# Patient Record
Sex: Female | Born: 1970 | Hispanic: No | Marital: Married | Smoking: Never smoker
Health system: Southern US, Community
[De-identification: ages and names within clinical notes are randomized; demographics above are authoritative.]

---

## 2003-03-22 ENCOUNTER — Other Ambulatory Visit: Admission: RE | Admit: 2003-03-22 | Discharge: 2003-03-22 | Payer: Self-pay | Admitting: *Deleted

## 2003-04-22 ENCOUNTER — Encounter: Payer: Self-pay | Admitting: Emergency Medicine

## 2003-04-22 ENCOUNTER — Emergency Department (HOSPITAL_COMMUNITY): Admission: EM | Admit: 2003-04-22 | Discharge: 2003-04-22 | Payer: Self-pay | Admitting: Emergency Medicine

## 2003-04-26 ENCOUNTER — Encounter: Payer: Self-pay | Admitting: Neurology

## 2003-04-26 ENCOUNTER — Ambulatory Visit (HOSPITAL_COMMUNITY): Admission: RE | Admit: 2003-04-26 | Discharge: 2003-04-26 | Payer: Self-pay | Admitting: Neurology

## 2003-09-20 ENCOUNTER — Inpatient Hospital Stay (HOSPITAL_COMMUNITY): Admission: AD | Admit: 2003-09-20 | Discharge: 2003-09-22 | Payer: Self-pay | Admitting: Obstetrics & Gynecology

## 2004-01-01 ENCOUNTER — Encounter: Admission: RE | Admit: 2004-01-01 | Discharge: 2004-01-01 | Payer: Self-pay | Admitting: Obstetrics and Gynecology

## 2004-01-04 ENCOUNTER — Ambulatory Visit (HOSPITAL_COMMUNITY): Admission: RE | Admit: 2004-01-04 | Discharge: 2004-01-04 | Payer: Self-pay | Admitting: *Deleted

## 2020-05-16 ENCOUNTER — Other Ambulatory Visit: Payer: Self-pay | Admitting: Obstetrics and Gynecology

## 2020-05-16 DIAGNOSIS — Z1231 Encounter for screening mammogram for malignant neoplasm of breast: Secondary | ICD-10-CM

## 2020-07-23 ENCOUNTER — Ambulatory Visit
Admission: RE | Admit: 2020-07-23 | Discharge: 2020-07-23 | Disposition: A | Discharge status: Home / Self Care | Payer: No Typology Code available for payment source | Source: Ambulatory Visit | Attending: Obstetrics and Gynecology | Admitting: Obstetrics and Gynecology

## 2020-07-23 ENCOUNTER — Other Ambulatory Visit: Payer: Self-pay

## 2020-07-23 ENCOUNTER — Ambulatory Visit: Payer: Self-pay | Admitting: *Deleted

## 2020-07-23 VITALS — BP 120/86 | Wt 222.0 lb

## 2020-07-23 DIAGNOSIS — Z1239 Encounter for other screening for malignant neoplasm of breast: Secondary | ICD-10-CM

## 2020-07-23 DIAGNOSIS — Z1231 Encounter for screening mammogram for malignant neoplasm of breast: Secondary | ICD-10-CM

## 2020-07-23 NOTE — Patient Instructions (Addendum)
Explained breast self awareness with Kristin Rhodes. Patient did not need a Pap smear today due to last Pap smear was 04/12/2020 per patient. Let her know [facility] will cover Pap smears every 3 years unless has a history of abnormal Pap smears. Referred patient to the [facility] for a screening mammogram. Appointment scheduled Tuesday, July 23, 2020 at 1100 on the mobile unit. Patient escorted to the mobile unit following [facility] appointment for her screening mammogram. Let patient know the Breast Center will follow up with her within the next couple weeks with results of her mammogram by letter or phone. Kristin Rhodes verbalized understanding.  [name], Kathaleen Maser, RN 10:16 AM

## 2020-07-23 NOTE — Progress Notes (Signed)
Ms. Kristin Rhodes is a 49 y.o. female who presents to [facility] clinic today with no complaints.    Pap Smear: Pap smear not completed today. Last Pap smear was 04/12/2020 at [facility] clinic and was normal per patient. Per patient has no history of an abnormal Pap smear. Last Pap smear result is not available in Epic.   Physical exam: Breasts Breasts symmetrical. No skin abnormalities bilateral breasts. No nipple retraction bilateral breasts. No nipple discharge bilateral breasts. No lymphadenopathy. No lumps palpated bilateral breasts. No complaints of pain or tenderness on exam.       Pelvic/Bimanual Pap is not indicated today per [facility] guidelines.   Smoking History: Patient has never smoked.   Patient Navigation: Patient education provided. Access to services provided for patient through [employer] program. Used Spanish interpreter Natale Lay from [facility] provided.    Colorectal Cancer Screening: Per patient has never had colonoscopy completed. No complaints today.    Breast and Cervical Cancer Risk Assessment: Patient has family history of her mother and maternal grandmother having breast cancer. Patient has no known genetic mutations or history of radiation treatment to the chest before age 75. Patient does not have history of cervical dysplasia, immunocompromised, or DES exposure in-utero.  Risk Assessment    Risk Scores      07/23/2020   Last edited by: Priscille Heidelberg, RN   5-year risk: 1.2 %   Lifetime risk: 11 %          A: [facility] exam without pap smear No complaints.  P: Referred patient to the [facility] for a screening mammogram on the mobile unit. Appointment scheduled Tuesday, July 23, 2020 at 1100.  Priscille Heidelberg, RN 07/23/2020 10:16 AM

## 2021-03-03 ENCOUNTER — Other Ambulatory Visit: Payer: Self-pay

## 2021-03-03 ENCOUNTER — Encounter (HOSPITAL_COMMUNITY): Payer: Self-pay

## 2021-03-03 ENCOUNTER — Emergency Department (HOSPITAL_COMMUNITY): Payer: No Typology Code available for payment source

## 2021-03-03 ENCOUNTER — Emergency Department (HOSPITAL_COMMUNITY)
Admission: EM | Admit: 2021-03-03 | Discharge: 2021-03-03 | Disposition: A | Discharge status: Home / Self Care | Payer: No Typology Code available for payment source | Attending: Emergency Medicine | Admitting: Emergency Medicine

## 2021-03-03 DIAGNOSIS — W268XXA Contact with other sharp object(s), not elsewhere classified, initial encounter: Secondary | ICD-10-CM | POA: Insufficient documentation

## 2021-03-03 DIAGNOSIS — Z23 Encounter for immunization: Secondary | ICD-10-CM | POA: Insufficient documentation

## 2021-03-03 DIAGNOSIS — S61211A Laceration without foreign body of left index finger without damage to nail, initial encounter: Secondary | ICD-10-CM | POA: Insufficient documentation

## 2021-03-03 DIAGNOSIS — Y93H2 Activity, gardening and landscaping: Secondary | ICD-10-CM | POA: Insufficient documentation

## 2021-03-03 MED ORDER — CEPHALEXIN 500 MG PO CAPS: 500.0000 mg | ORAL_CAPSULE | Freq: Four times a day (QID) | ORAL | 0 refills | Status: AC

## 2021-03-03 MED ORDER — HYDROCODONE-ACETAMINOPHEN 5-325 MG PO TABS: 2.0000 | ORAL_TABLET | Freq: Four times a day (QID) | ORAL | 0 refills | Status: AC | PRN

## 2021-03-03 MED ORDER — ACETAMINOPHEN 325 MG PO TABS
650.0000 mg | ORAL_TABLET | Freq: Once | ORAL | Status: AC
  Administered 2021-03-03: 650 mg via ORAL
  Filled 2021-03-03: qty 2

## 2021-03-03 MED ORDER — BUPIVACAINE HCL (PF) 0.5 % IJ SOLN
20.0000 mL | Freq: Once | INTRAMUSCULAR | Status: AC
  Administered 2021-03-03: 20 mL
  Filled 2021-03-03: qty 20

## 2021-03-03 MED ORDER — LIDOCAINE HCL 2 % IJ SOLN: 5.0000 mL | Freq: Once | INTRAMUSCULAR | Status: DC

## 2021-03-03 MED ORDER — LIDOCAINE HCL 2 % IJ SOLN
10.0000 mL | Freq: Once | INTRAMUSCULAR | Status: DC
Start: 2021-03-03 — End: 2021-03-03

## 2021-03-03 MED ORDER — BUPIVACAINE HCL 0.25 % IJ SOLN
10.0000 mL | Freq: Once | INTRAMUSCULAR | Status: DC
  Filled 2021-03-03: qty 10

## 2021-03-03 MED ORDER — TETANUS-DIPHTH-ACELL PERTUSSIS 5-2.5-18.5 LF-MCG/0.5 IM SUSY
0.5000 mL | PREFILLED_SYRINGE | Freq: Once | INTRAMUSCULAR | Status: AC
  Administered 2021-03-03: 0.5 mL via INTRAMUSCULAR
  Filled 2021-03-03: qty 0.5

## 2021-03-03 NOTE — ED Provider Notes (Signed)
Emergency Medicine Provider Triage Evaluation Note  Kristin Rhodes , a 50 y.o. female  was evaluated in triage.  Pt complains of laceration to index finger after injuring it with a weedeater 1 hour prior to arrival.  Unknown tetanus..  Review of Systems  Positive: Laceration Negative: Numbness, tingling or weakness in the hand.  Physical Exam  BP (!) 151/95 (BP Location: Right Arm)   Pulse 95   Temp 98.3 F (36.8 C) (Oral)   Resp 16   Ht 5\' 4"  (1.626 m)   Wt 95.3 kg   LMP 05/08/2020 (Exact Date)   SpO2 100%   BMI 36.05 kg/m  Gen:   Awake, no distress   Resp:  Normal effort  MSK:   Moves extremities without difficulty  Other:  Full range of motion of the left index finger.  Extensive laceration to the dorsum of the distal finger, distal to the DIP joint.  Oozing small amount of blood.  Normal sensation in the ventral surface of the hand, normal cap refill.  Medical Decision Making  Medically screening exam initiated at 12:56 PM.  Appropriate orders placed.  Kristin Rhodes was informed that the remainder of the evaluation will be completed by another provider, this initial triage assessment does not replace that evaluation, and the importance of remaining in the ED until their evaluation is complete.  This chart was dictated using voice recognition software, Dragon. Despite the best efforts of this provider to proofread and correct errors, errors may still occur which can change documentation meaning.    Orland Mustard 03/03/21 1257    05/03/21, MD 03/03/21 2012

## 2021-03-03 NOTE — ED Provider Notes (Signed)
[facility] EMERGENCY DEPARTMENT Provider Note   CSN: [id number] Arrival date & time: 03/03/21  1214     History Chief Complaint  Patient presents with   Finger Injury    Kristin Rhodes is a 50 y.o. female.  The history is provided by the patient. The history is limited by a language barrier. A language interpreter was used.  Laceration Location:  Finger Finger laceration location:  L index finger Depth:  Through underlying tissue Quality: stellate   Bleeding: controlled   Injury mechanism: [employer]. Pain details:    Quality:  Sharp and throbbing   Severity:  Severe   Timing:  Constant   Progression:  Unchanged Foreign body present:  Unable to specify Relieved by:  None tried Tetanus status:  Unknown Associated symptoms: swelling   Associated symptoms: no fever, no focal weakness, no numbness and no rash       History reviewed. No pertinent past medical history.  There are no problems to display for this patient.   History reviewed. No pertinent surgical history.   OB History   No obstetric history on file.     Family History  Problem Relation Age of Onset   Breast cancer Mother    Breast cancer Maternal Grandmother     Social History   Tobacco Use   Smoking status: Never   Smokeless tobacco: Never  Vaping Use   Vaping Use: Never used  Substance Use Topics   Alcohol use: Not Currently   Drug use: Never    Home Medications Prior to Admission medications   Medication Sig Start Date End Date Taking? Authorizing Provider  cephALEXin (KEFLEX) 500 MG capsule Take 1 capsule (500 mg total) by mouth 4 (four) times daily for 7 days. 03/03/21 03/10/21 Yes Ernie Avena, MD  HYDROcodone-acetaminophen (NORCO/VICODIN) 5-325 MG tablet Take 2 tablets by mouth every 6 (six) hours as needed for up to 3 days for severe pain. 03/03/21 03/06/21 Yes Ernie Avena, MD    Allergies    Patient has no allergy information on record.  Review of  Systems   Review of Systems  Constitutional:  Negative for chills and fever.  HENT:  Negative for ear pain and sore throat.   Eyes:  Negative for pain and visual disturbance.  Respiratory:  Negative for cough and shortness of breath.   Cardiovascular:  Negative for chest pain and palpitations.  Gastrointestinal:  Negative for abdominal pain and vomiting.  Genitourinary:  Negative for dysuria and hematuria.  Musculoskeletal:  Negative for arthralgias and back pain.  Skin:  Positive for wound. Negative for color change and rash.  Neurological:  Negative for focal weakness, seizures and syncope.  All other systems reviewed and are negative.  Physical Exam Updated Vital Signs BP (!) 154/88 (BP Location: Right Arm)   Pulse 80   Temp 97.9 F (36.6 C) (Oral)   Resp 17   Ht 5\' 4"  (1.626 m)   Wt 95.3 kg   LMP 05/08/2020 (Exact Date)   SpO2 100%   BMI 36.05 kg/m   Physical Exam Vitals and nursing note reviewed.  Constitutional:      General: She is not in acute distress.    Appearance: She is well-developed.     Comments: Tearful, in pain  HENT:     Head: Normocephalic and atraumatic.  Eyes:     Conjunctiva/sclera: Conjunctivae normal.  Cardiovascular:     Rate and Rhythm: Normal rate and regular rhythm.  Heart sounds: No murmur heard. Pulmonary:     Effort: Pulmonary effort is normal. No respiratory distress.     Breath sounds: Normal breath sounds.  Abdominal:     Palpations: Abdomen is soft.     Tenderness: There is no abdominal tenderness.  Musculoskeletal:        General: Swelling, tenderness and signs of injury present.     Cervical back: Neck supple.     Comments: Stellate, jagged, macerated lacerations along the palmar distal tip of the left index finger. No nail bed involvement, no foreign bodies, no tendon involvement, L hand and finger NVI  Skin:    General: Skin is warm and dry.  Neurological:     General: No focal deficit present.     Mental Status: She is  alert. Mental status is at baseline.             ED Results / Procedures / Treatments   Labs (all labs ordered are listed, but only abnormal results are displayed) Labs Reviewed - No data to display  EKG None  Radiology DG Finger Index Left  Result Date: 03/03/2021 CLINICAL DATA:  Left index finger laceration yesterday. EXAM: LEFT INDEX FINGER 2+V COMPARISON:  None. FINDINGS: Significant soft tissue injury distally with swelling and irregularity. Allowing for overlying bandages, no definite radiopaque foreign body. No evidence of acute fracture or dislocation. IMPRESSION: Distal soft tissue injury of the index finger without evidence of acute osseous injury. Electronically Signed   By: Carey Bullocks M.D.   On: 03/03/2021 13:34    Procedures .Marland KitchenLaceration Repair  Date/Time: 03/03/2021 5:35 PM Performed by: Ernie Avena, MD Authorized by: Ernie Avena, MD   Consent:    Consent obtained:  Verbal   Consent given by:  Patient   Risks discussed:  Infection, pain, retained foreign body, poor cosmetic result, poor wound healing, need for additional repair, vascular damage and nerve damage Universal protocol:    Patient identity confirmed:  Verbally with patient Anesthesia:    Anesthesia method:  Nerve block   Block location:  Digital   Block needle gauge:  25 G   Block anesthetic:  Bupivacaine 0.5% w/o epi   Block injection procedure:  Anatomic landmarks identified, introduced needle, incremental injection, anatomic landmarks palpated and negative aspiration for blood   Block outcome:  Anesthesia achieved Laceration details:    Location:  Finger   Finger location:  L index finger Pre-procedure details:    Preparation:  Patient was prepped and draped in usual sterile fashion and imaging obtained to evaluate for foreign bodies Exploration:    Imaging obtained: x-ray     Imaging outcome: foreign body not noted     Wound exploration: wound explored through full range of  motion and entire depth of wound visualized     Wound extent: no foreign bodies/material noted, no nerve damage noted, no tendon damage noted, no underlying fracture noted and no vascular damage noted     Contaminated: yes   Treatment:    Area cleansed with:  Povidone-iodine and saline   Amount of cleaning:  Extensive   Irrigation solution:  Sterile saline   Irrigation volume:  500cc   Irrigation method:  Syringe   Visualized foreign bodies/material removed: no     Debridement:  None   Undermining:  None   Layers/structures repaired:  Deep subcutaneous Deep subcutaneous:    Wound subcutaneous closure material used: Nylon. Skin repair:    Repair method:  Sutures   Suture size:  5-0   Suture material:  Nylon   Suture technique:  Simple interrupted   Number of sutures:  26 Approximation:    Approximation:  Close Repair type:    Repair type:  Complex Post-procedure details:    Dressing:  Sterile dressing and tube gauze   Procedure completion:  Tolerated well, no immediate complications   Medications Ordered in ED Medications  acetaminophen (TYLENOL) tablet 650 mg (650 mg Oral Given 03/03/21 1552)  Tdap (BOOSTRIX) injection 0.5 mL (0.5 mLs Intramuscular Given 03/03/21 1553)  bupivacaine (MARCAINE) 0.5 % injection 20 mL (20 mLs Infiltration Given 03/03/21 1740)    ED Course  I have reviewed the triage vital signs and the nursing notes.  Pertinent labs & imaging results that were available during my care of the patient were reviewed by me and considered in my medical decision making (see chart for details).    MDM Rules/Calculators/A&P                           50 year old female presenting with a left finger laceration after sustaining an injury from a weed [employer].  The patient arrived hemostatic, left finger neurovascular intact, no clear tendon involvement.  X-ray imaging without evidence of foreign body and no foreign body appreciated on visual inspection.  No underlying  fracture.  No nailbed involvement.  The patient had a complicated macerated stellate and jagged laceration along the palmar aspect of the distal tip of the left index finger.  Her tetanus was updated in the emergency department.  I spoke with orthopedics who recommended primary closure in the emergency department and follow-up with hand outpatient.  Primary closure was obtained with 26 sutures given multiple pieces of lacerated and macerated tissue.  Keflex was provided for antibiotic prophylaxis.  The patient was provided Norco for pain control.  The wound was wrapped with gauze and wound care instructions were provided.  Plan for wound reassessment and suture removal in 7 to 10 days outpatient.  Referral placed.   Final Clinical Impression(s) / ED Diagnoses Final diagnoses:  Laceration of left index finger without foreign body without damage to nail, initial encounter    Rx / DC Orders ED Discharge Orders          Ordered    cephALEXin (KEFLEX) 500 MG capsule  4 times daily        03/03/21 1741    HYDROcodone-acetaminophen (NORCO/VICODIN) 5-325 MG tablet  Every 6 hours PRN        03/03/21 1744             Ernie Avena, MD 03/03/21 2227

## 2021-03-03 NOTE — ED Triage Notes (Signed)
Pt lacerated her left index finger with weed cutter this morning. Small amount of bleeding noted. Unknown TDAP.

## 2021-03-03 NOTE — Discharge Instructions (Addendum)
Please follow-up with hand surgery in 10 days for suture removal and evaluation by a hand specialist

## 2021-03-03 NOTE — ED Notes (Signed)
Provider at bedside suturing finger. Will attempt vitals again later.

## 2021-03-14 ENCOUNTER — Encounter (HOSPITAL_COMMUNITY): Payer: Self-pay | Admitting: Emergency Medicine

## 2021-03-14 ENCOUNTER — Emergency Department (HOSPITAL_COMMUNITY)
Admission: EM | Admit: 2021-03-14 | Discharge: 2021-03-14 | Disposition: A | Discharge status: Home / Self Care | Payer: No Typology Code available for payment source | Attending: Emergency Medicine | Admitting: Emergency Medicine

## 2021-03-14 DIAGNOSIS — W293XXD Contact with powered garden and outdoor hand tools and machinery, subsequent encounter: Secondary | ICD-10-CM | POA: Insufficient documentation

## 2021-03-14 DIAGNOSIS — Z4802 Encounter for removal of sutures: Secondary | ICD-10-CM

## 2021-03-14 DIAGNOSIS — S61211D Laceration without foreign body of left index finger without damage to nail, subsequent encounter: Secondary | ICD-10-CM | POA: Insufficient documentation

## 2021-03-14 MED ORDER — BACITRACIN ZINC 500 UNIT/GM EX OINT
TOPICAL_OINTMENT | Freq: Once | CUTANEOUS | Status: AC
  Filled 2021-03-14: qty 0.9

## 2021-03-14 NOTE — ED Notes (Signed)
Bacitracin, dressing applied to finger lac. Additional supplies given to pt. Discussed discharge instructions, pt verbalized understanding. Denies questions. VSS, no distress noted. Steady gait to exit.

## 2021-03-14 NOTE — ED Notes (Signed)
ED PA at bedside suturing finger lac at this time

## 2021-03-14 NOTE — ED Provider Notes (Signed)
[facility] EMERGENCY DEPARTMENT Provider Note   CSN: [id number] Arrival date & time: 03/14/21  1121     History No chief complaint on file.   Kristin Rhodes is a 50 y.o. female presenting to the ED for suture removal.  Had 26 sutures placed on her left index finger on 03/03/2021 from a weed [place].  Reports she still has pain denies any numbness.  HPI     No past medical history on file.  There are no problems to display for this patient.   No past surgical history on file.   OB History   No obstetric history on file.     Family History  Problem Relation Age of Onset   Breast cancer Mother    Breast cancer Maternal Grandmother     Social History   Tobacco Use   Smoking status: Never   Smokeless tobacco: Never  Vaping Use   Vaping Use: Never used  Substance Use Topics   Alcohol use: Not Currently   Drug use: Never    Home Medications Prior to Admission medications   Not on File    Allergies    Patient has no known allergies.  Review of Systems   Review of Systems  Constitutional:  Negative for chills and fever.  Skin:  Positive for wound.  Neurological:  Negative for numbness.   Physical Exam Updated Vital Signs BP (!) 139/91   Pulse 72   Temp 98.5 F (36.9 C)   Resp 14   LMP 05/08/2020 (Exact Date)   SpO2 98%   Physical Exam Vitals and nursing note reviewed.  Constitutional:      General: She is not in acute distress.    Appearance: She is well-developed. She is not diaphoretic.  HENT:     Head: Normocephalic and atraumatic.  Eyes:     General: No scleral icterus.    Conjunctiva/sclera: Conjunctivae normal.  Pulmonary:     Effort: Pulmonary effort is normal. No respiratory distress.  Musculoskeletal:     Cervical back: Normal range of motion.  Skin:    Findings: No rash.     Comments: Sutures in place on left second digit pad distally.  No bleeding, erythema or swelling noted.  Neurological:     Mental  Status: She is alert.    ED Results / Procedures / Treatments   Labs (all labs ordered are listed, but only abnormal results are displayed) Labs Reviewed - No data to display  EKG None  Radiology No results found.  Procedures .Suture Removal  Date/Time: 03/14/2021 1:26 PM Performed by: Dietrich Pates, PA-C Authorized by: Dietrich Pates, PA-C   Consent:    Consent obtained:  Verbal   Consent given by:  Patient   Risks discussed:  Bleeding, pain and wound separation   Alternatives discussed:  No treatment Universal protocol:    Patient identity confirmed:  Verbally with patient Location:    Location:  Upper extremity   Upper extremity location:  Hand   Hand location:  L index finger Procedure details:    Wound appearance:  No signs of infection   Number of sutures removed:  26 Post-procedure details:    Post-removal:  Antibiotic ointment applied   Procedure completion:  Tolerated well, no immediate complications Comments:     There is slight wound dehiscence in 1 area but no purulent drainage or bleeding noted.  I suspect part of this could be from removing dead tissue.   Medications Ordered in  ED Medications  bacitracin ointment (has no administration in time range)    ED Course  I have reviewed the triage vital signs and the nursing notes.  Pertinent labs & imaging results that were available during my care of the patient were reviewed by me and considered in my medical decision making (see chart for details).    MDM Rules/Calculators/A&P                           Patient presents to ED for suture removal and wound check as above.  Slight wound dehiscence noted which I feel is due to removing dead tissue.  We will dressed with antibiotic ointment.  Vitals normal.  Laceration appears to be healing well with no signs of infection or dehiscence. Sutures removed successfully here, patient tolerated procedure well. Scar minimization & wound care instructions given. ED  return precautions given at discharge.     Patient is hemodynamically stable, in NAD, and able to ambulate in the ED. Evaluation does not show pathology that would require ongoing emergent intervention or inpatient treatment. I explained the diagnosis to the patient. Pain has been managed and has no complaints prior to discharge. Patient is comfortable with above plan and is stable for discharge at this time. All questions were answered prior to disposition. Strict return precautions for returning to the ED were discussed. Encouraged follow up with PCP.   An After Visit Summary was printed and given to the patient.   Portions of this note were generated with Scientist, clinical (histocompatibility and immunogenetics). Dictation errors may occur despite best attempts at proofreading.   Final Clinical Impression(s) / ED Diagnoses Final diagnoses:  Encounter for removal of sutures    Rx / DC Orders ED Discharge Orders     None        Dietrich Pates, PA-C 03/14/21 1328    Pollyann Savoy, MD 03/14/21 [phone]

## 2021-03-14 NOTE — Discharge Instructions (Addendum)
Return to the ER for signs of infection including redness, drainage, increased pain or fever.  Regrese a la sala de emergencias en busca de signos de infeccin, como enrojecimiento, drenaje, aumento del dolor o fiebre.

## 2021-03-14 NOTE — ED Triage Notes (Signed)
Pt here to have sutures removed

## 2022-11-04 IMAGING — DX DG FINGER INDEX 2+V*L*
3 series · 3 of 3 positions shown · non-contrast
Comparison: None.

CLINICAL DATA: Left index finger laceration yesterday.

EXAM:
LEFT INDEX FINGER 2+V

[finger ap]
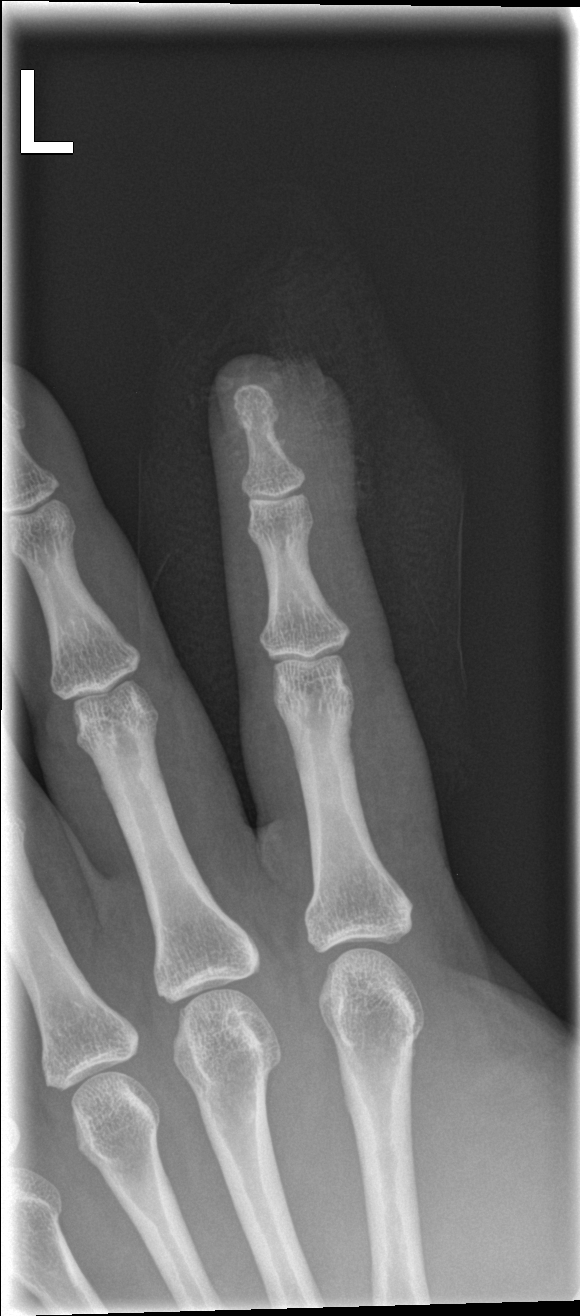

[finger obl]
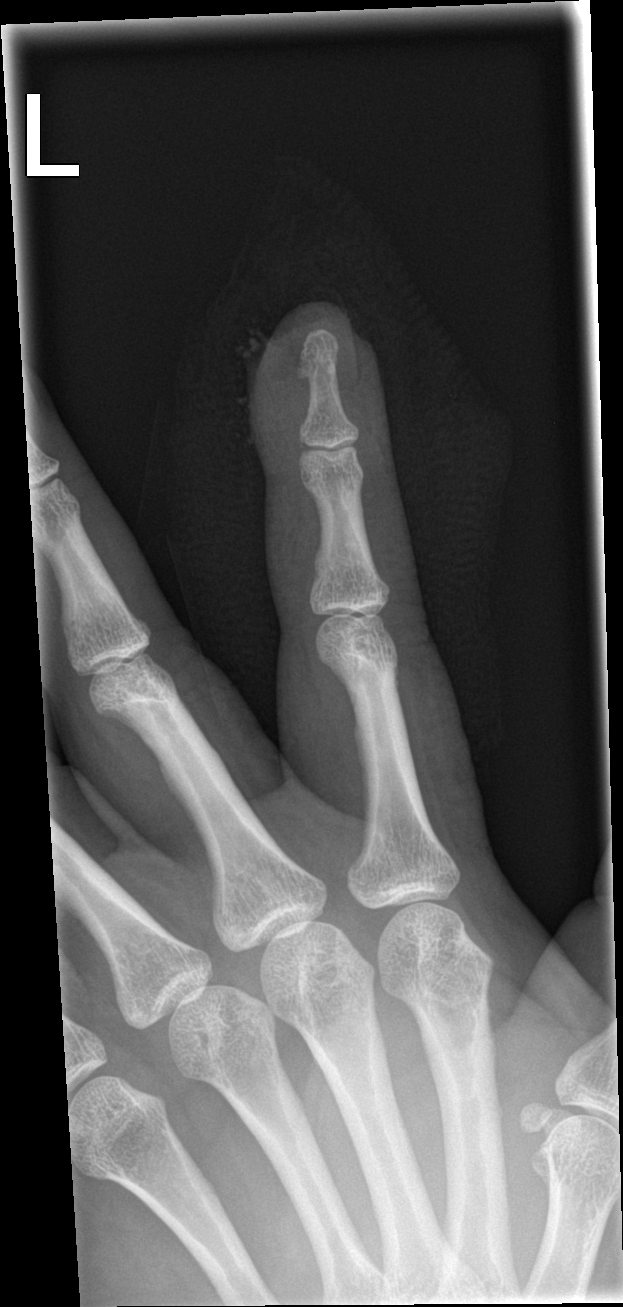

[finger lat]
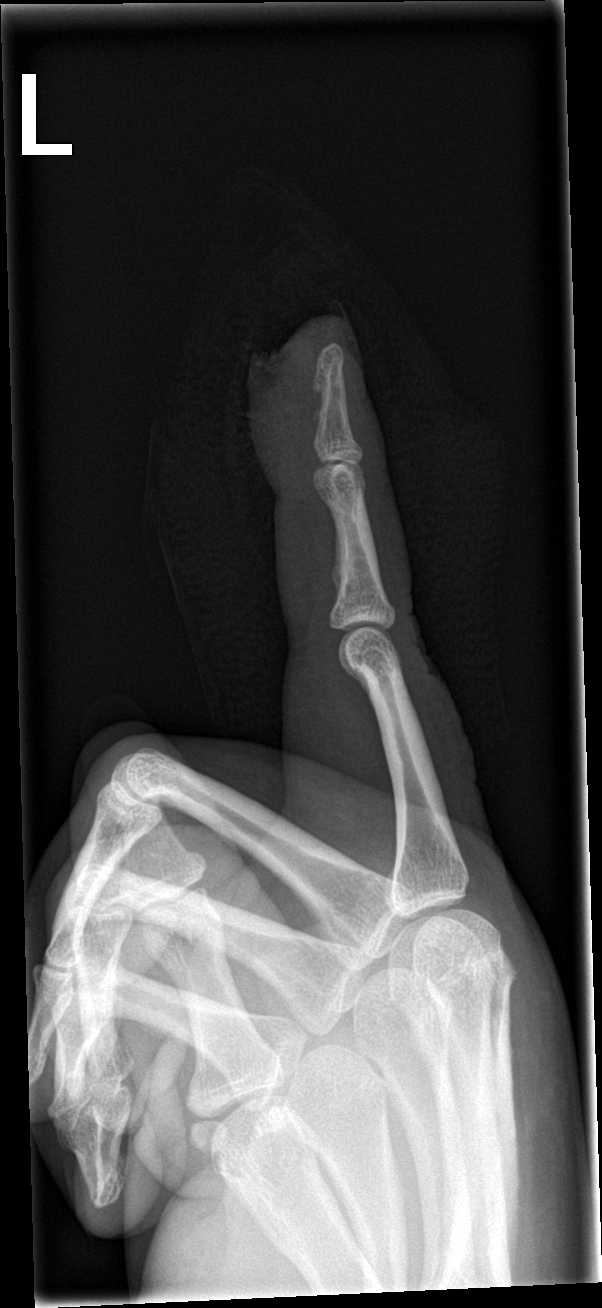

[3 of 3 positions shown; findings below may reference images not displayed]

FINDINGS: Significant soft tissue injury distally with swelling and
irregularity. Allowing for overlying bandages, no definite
radiopaque foreign body. No evidence of acute fracture or
dislocation.
IMPRESSION: Distal soft tissue injury of the index finger without evidence of
acute osseous injury.
